# Patient Record
Sex: Male | Born: 1960 | Race: Black or African American | Hispanic: No | Marital: Single
Health system: Southern US, Community
[De-identification: ages and names within clinical notes are randomized; demographics above are authoritative.]

---

## 1998-05-26 ENCOUNTER — Encounter: Payer: Self-pay | Admitting: Emergency Medicine

## 1998-05-26 ENCOUNTER — Emergency Department (HOSPITAL_COMMUNITY): Admission: EM | Admit: 1998-05-26 | Discharge: 1998-05-26 | Payer: Self-pay | Admitting: Emergency Medicine

## 1998-08-17 ENCOUNTER — Emergency Department (HOSPITAL_COMMUNITY): Admission: EM | Admit: 1998-08-17 | Discharge: 1998-08-17 | Payer: Self-pay | Admitting: Emergency Medicine

## 1998-09-26 ENCOUNTER — Emergency Department (HOSPITAL_COMMUNITY): Admission: EM | Admit: 1998-09-26 | Discharge: 1998-09-26 | Payer: Self-pay | Admitting: Emergency Medicine

## 1999-08-02 ENCOUNTER — Inpatient Hospital Stay: Admission: EM | Admit: 1999-08-02 | Discharge: 1999-08-07 | Payer: Self-pay

## 2002-02-13 ENCOUNTER — Inpatient Hospital Stay (HOSPITAL_COMMUNITY): Admission: EM | Admit: 2002-02-13 | Discharge: 2002-02-18 | Payer: Self-pay | Admitting: Emergency Medicine

## 2002-02-13 ENCOUNTER — Encounter: Payer: Self-pay | Admitting: Emergency Medicine

## 2002-02-14 ENCOUNTER — Encounter: Payer: Self-pay | Admitting: Cardiology

## 2002-03-07 ENCOUNTER — Encounter: Payer: Self-pay | Admitting: Emergency Medicine

## 2002-03-08 ENCOUNTER — Encounter: Payer: Self-pay | Admitting: Thoracic Surgery (Cardiothoracic Vascular Surgery)

## 2002-03-08 ENCOUNTER — Inpatient Hospital Stay (HOSPITAL_COMMUNITY): Admission: EM | Admit: 2002-03-08 | Discharge: 2002-03-15 | Payer: Self-pay | Admitting: Emergency Medicine

## 2002-03-09 ENCOUNTER — Encounter: Payer: Self-pay | Admitting: Thoracic Surgery (Cardiothoracic Vascular Surgery)

## 2002-03-10 ENCOUNTER — Encounter: Payer: Self-pay | Admitting: Thoracic Surgery (Cardiothoracic Vascular Surgery)

## 2002-03-11 ENCOUNTER — Encounter: Payer: Self-pay | Admitting: Thoracic Surgery (Cardiothoracic Vascular Surgery)

## 2002-03-12 ENCOUNTER — Encounter: Payer: Self-pay | Admitting: Thoracic Surgery (Cardiothoracic Vascular Surgery)

## 2002-04-21 ENCOUNTER — Encounter
Admission: RE | Admit: 2002-04-21 | Discharge: 2002-04-21 | Payer: Self-pay | Admitting: Thoracic Surgery (Cardiothoracic Vascular Surgery)

## 2002-04-21 ENCOUNTER — Encounter: Payer: Self-pay | Admitting: Thoracic Surgery (Cardiothoracic Vascular Surgery)

## 2005-10-06 ENCOUNTER — Inpatient Hospital Stay (HOSPITAL_COMMUNITY): Admission: EM | Admit: 2005-10-06 | Discharge: 2005-10-08 | Payer: Self-pay | Admitting: Emergency Medicine

## 2005-10-27 ENCOUNTER — Emergency Department (HOSPITAL_COMMUNITY): Admission: EM | Admit: 2005-10-27 | Discharge: 2005-10-27 | Payer: Self-pay | Admitting: Emergency Medicine

## 2006-04-02 ENCOUNTER — Emergency Department (HOSPITAL_COMMUNITY): Admission: EM | Admit: 2006-04-02 | Discharge: 2006-04-02 | Payer: Self-pay | Admitting: Emergency Medicine

## 2006-06-05 ENCOUNTER — Emergency Department (HOSPITAL_COMMUNITY): Admission: EM | Admit: 2006-06-05 | Discharge: 2006-06-05 | Payer: Self-pay | Admitting: Emergency Medicine

## 2008-03-24 IMAGING — CR DG CHEST 2V
2 series · 2 of 2 positions shown · non-contrast
Comparison: none

HISTORY: Hyperglycemia, dizziness, smoker, coronary artery disease status post
CABG

CHEST 2 VIEWS:
Comparison 10/06/2005
Normal heart size status post CABG.
Normal mediastinal contours and pulmonary vascularity.
Tiny scar left lung base.
Lungs otherwise clear.
No pleural effusion or pneumothorax.

[w chest pa *]
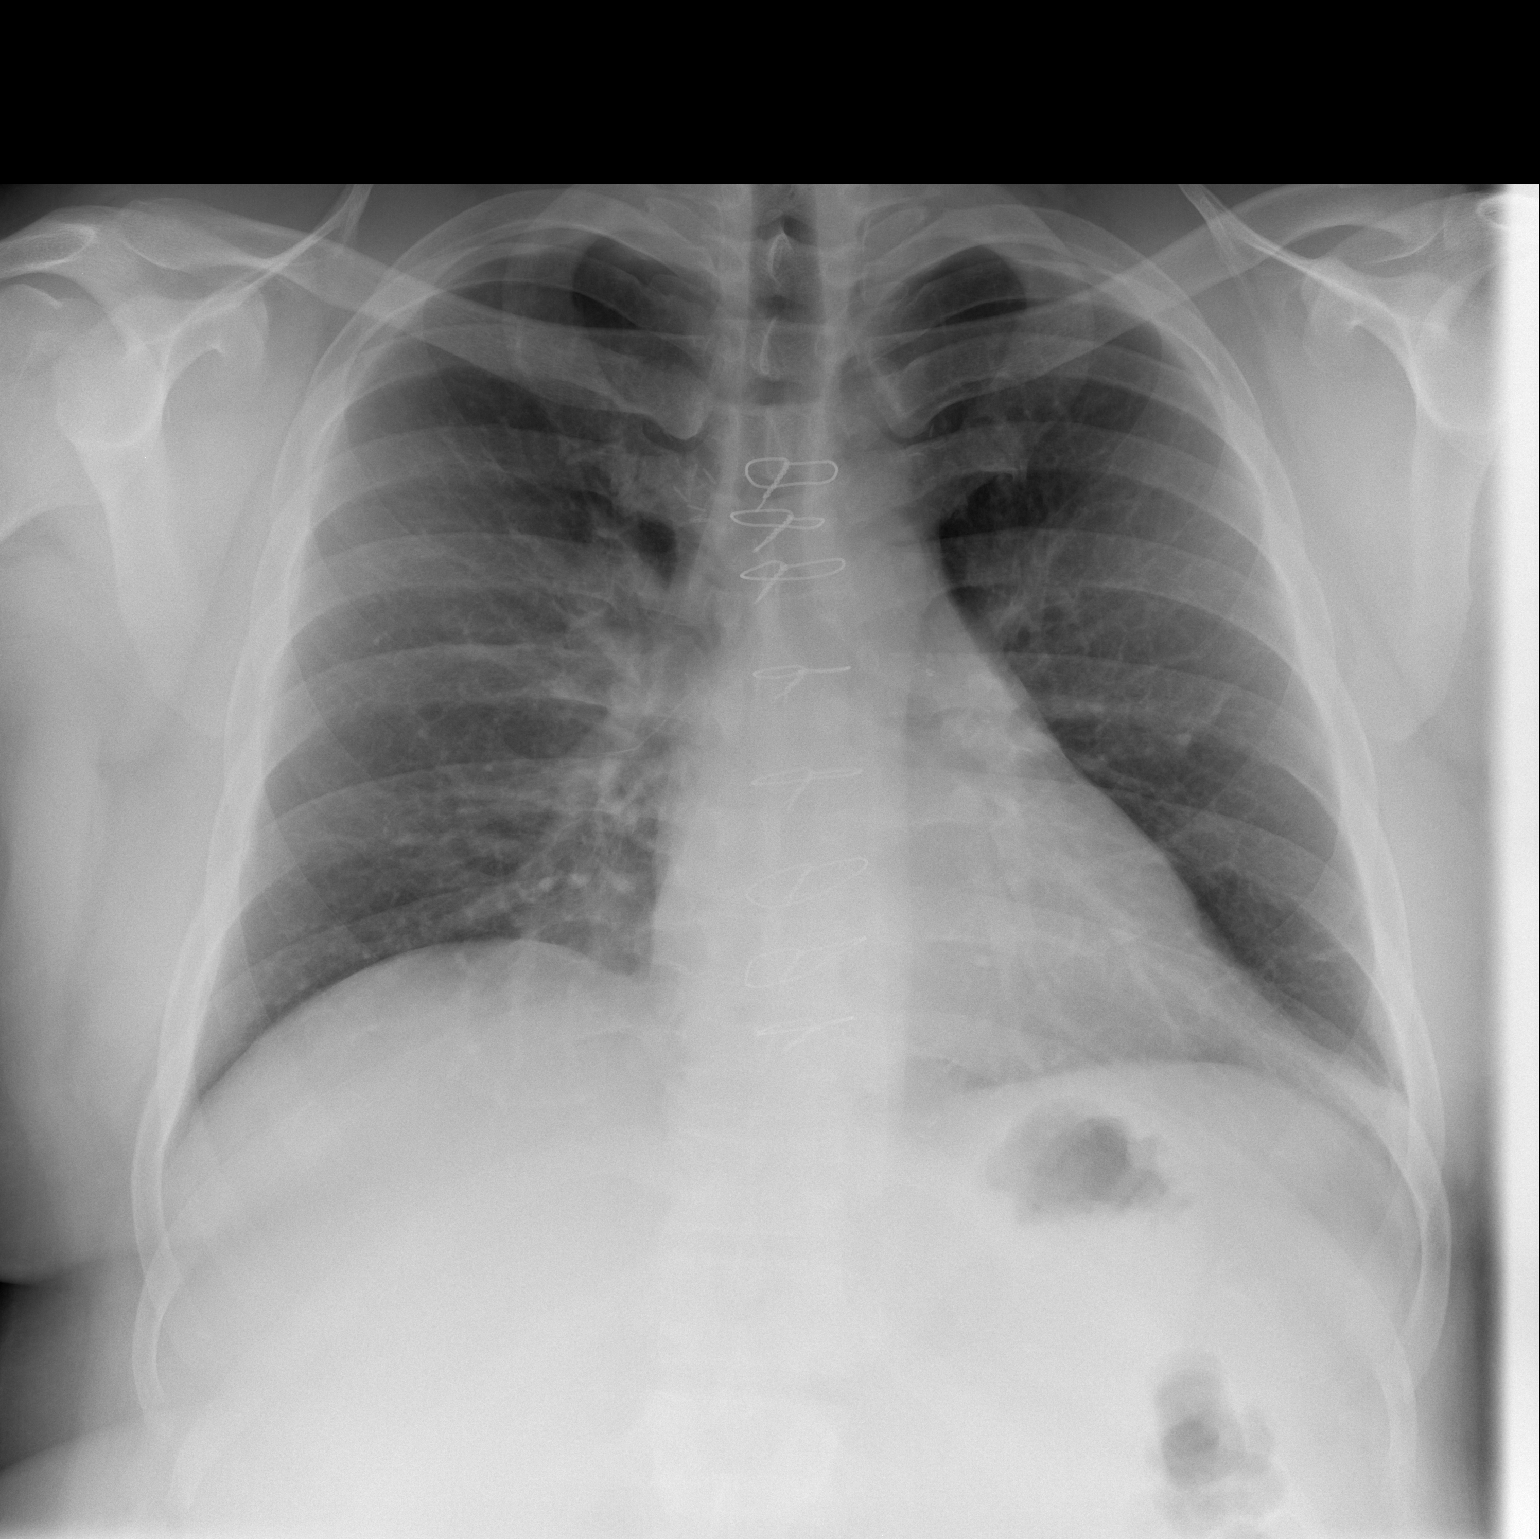

[w chest lat *]
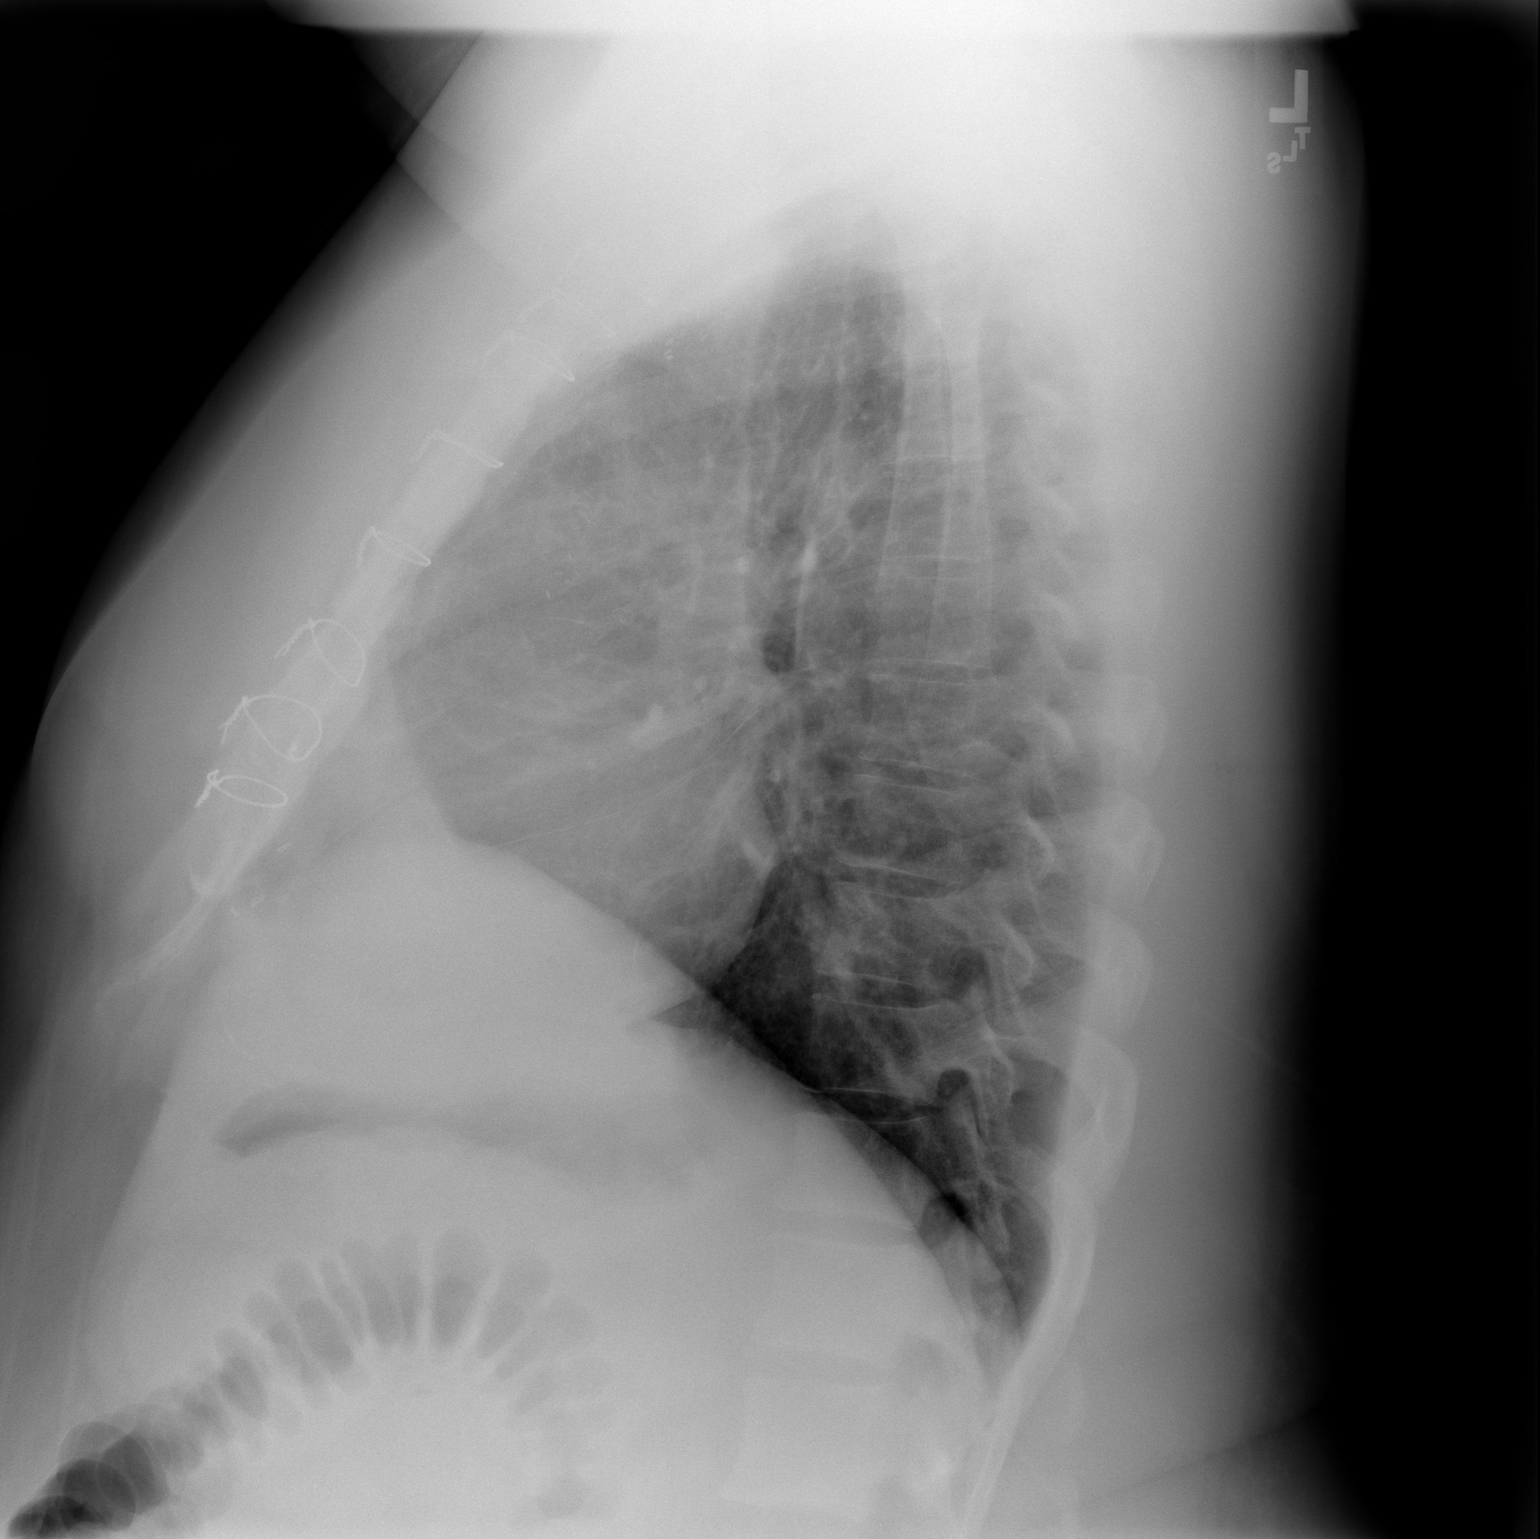

[2 of 2 positions shown; findings below may reference images not displayed]

IMPRESSION: No acute abnormalities.

## 2010-06-03 NOTE — Discharge Summary (Signed)
[facility]  Patient:    Billy [name], Billy [name]                     MRN: [id number] Adm. Date:  [id number] Disc. Date: 08/07/99 Attending:  Dolores Patty                           Discharge Summary  DATE OF BIRTH:  [birth year]  FINAL DIAGNOSES: 1. Ibuprofen overdose. 2. Suicidal attempt. 3. Liver transaminase elevation due to problem #1. 4. Major depression. 5. Cocaine overdose. 6. Gastroesophageal reflux.  DISCHARGE MEDICATIONS:  Protonix 40 mg once a day.  CONSULTATION:  Psychiatry - Dr. Jeanie Sewer, gastroenterology - Dr. Arlyce Dice.  CONDITION ON DISCHARGE:  Stable.  DISPOSITION:  The patient was committed to admission to [facility]. Transfer discussed with Dr. Elby Showers.  HISTORY OF PRESENT ILLNESS:  The patient is a 50 year old male who was admitted to [facility] after suicidal attempt with ibuprofen overdose.  He ingested approximately 60 to 70 over-the-counter ibuprofen Sunday morning at approximately 9 a.m. on July 31, 1999.  He presented to the emergency room on July 16 at 9:51.  Evaluation revealed profound elevation of the hepatic enzymes prompting admission for monitor.  For the details of this admission, please address to Dr. Caryl Never history and physical on August 02, 1999.  HOSPITAL COURSE:  During the course of hospitalization, the patient received IV fluids.  He received Cardizem due to elevation of his blood pressure and Mucomyst.  Hemodynamically, he remained stable.  There were no signs of hepatic encephalopathy.  GI was consulted.  Viral hepatitis A and B were without.  His transaminases have gradually improved.  He was evaluated by psychiatry at ______.  He was seen by Dr. Jeanie Sewer.  No antidepressants were administered during this hospital stay.  On the day of discharge, he has no physical complaints except for occasional indigestion, no chest pain, shortness of breath, nausea or vomiting.  No blood in the  stool.  His temperature is 97.4, heart rate 80, respirations 20, blood pressure 118/70.  His SGOT is 54, SGPT 928, bilirubin 0.7.  INR 1.0, PTT 37. He is in no acute distress.  HEENT moist.  Lungs clear.  Heart regular. Abdomen soft, nontender, no organomegaly, no masses felt.  He is alert, oriented, and cooperative.  LABORATORY DATA:  Drug screen on August 01, 1999, positive for cocaine and cannabis, negative for amphetamines, opiates, barbiturates, tricyclics, benzodiazepine, and phencyclidine.  On August 01, 1999, urinalysis with 40 ketones, 100 protein, 0 to 5 WBCs.  Hepatitis C antibody negative, hepatitis B surface antibody positive, hepatitis B surface antigen negative.  ALT level (SGPT) 6180 on August 02, 4859 on August 02, 2653 on August 04, 1763 on August 04, 1280 on July 21, and 928 on July 22.  AST level (SGOT) 6308 ON July 16, 3445 on the same day in 12 hours, 2013 on July 18, 352 on July 19, 162 on July 20, 85 on July 21, creatinine was normal.  Ammonia level was 49 on July 17.  Sodium and potassium normal.  White count initially 11.8, on July 19, 9.6. Hemoglobin 17.0, on July 19, 14.7.  Glucose was normal.  FOLLOW-UP RECOMMENDATIONS:  Obtain liver function tests and urinalysis in three to four days.   {sec COPIES}Dr. Elby Showers [facility], in [place]{end}DD:  08/07/99 TD:  08/07/99 Job: [id number]

## 2010-06-03 NOTE — H&P (Signed)
[facility]  Patient:    Billy [name], Billy [name]                     MRN: [id number] Adm. Date:  [id number] Attending:  Dolores Patty                         History and Physical  HISTORY OF PRESENT ILLNESS:  Billy [name] is a 50 year old African-American admitted with abdominal pain in the context of ibuprofen overdosage related to suicide ideation.  He ingested approximately 60-70 over-the-counter ibuprofen Sunday morning approximately 9 a.m. on July 31, 1999.  He presented to the emergency room tonight, July 16 at 9:51.  Evaluation revealed profound elevation of hepatic enzymes prompting admission for monitor.  PAST MEDICAL HISTORY:  His past medical history includes a similar attempt on one previous occasion.  He did not seek medical attention at that time but simply "slept for three days."  His only other hospitalization was for hand surgery.  He has recently been completed rehabilitation at [facility] for drug use.  MEDICATIONS:  He is on no prescription medications.  ALLERGIES:  He has no known drug allergies.  SOCIAL HISTORY:  He smokes 10 cigarettes a day.  He does not drink because "my father was an alcoholic."  FAMILY HISTORY:  Positive for myocardial infarction, stroke, hypertension, and diabetes.  There is no cancer in the family.  REVIEW OF SYSTEMS:  Positive for tachycardia which he attributes to drug use, specifically smokes crack cocaine.  He did have nausea and vomiting Sunday morning on three occasions.  He described the vomitus as "black stuff."  He now has steady, diffuse midabdominal pain which he describes as throbbing and 8 on a 10 scale.  He denies any melena or rectal bleeding.  He has had no other bleeding dyscrasias.  He denies chest pain or shortness of breath or GU symptoms.  He admits depression.  A suicide note was xeroxed and on the chart.  He has been evaluated by psychiatry who feels he should be admitted.   The suicide note outlines his desires for disposition of his body after his death. The note consists of apologies to various family members.  PHYSICAL EXAMINATION:  GENERAL:  On examination he appears uncomfortable but is in no acute distress.  VITAL SIGNS:  Blood pressure is 152/100, pulse 85, respiratory rate 18, temperature 97.6.  He is well-nourished.  SKIN:  Warm and dry.  HEENT:  There is a suggestion of arterial narrowing on fundal examination. Otolaryngologic examination is unremarkable.  He has no lymphadenopathy about the head or neck.  Thyroid is normal to palpation.  CHEST:  Totally clear to auscultation.  He has a grade 1 systolic murmur.  ABDOMEN:  Bowel sounds are hyperactive and he is slightly tender in the epigastrium.  Abdomen is soft without organomegaly.  GENITALIA/RECTAL:  Examinations were declined by the patient.  He categorically states "I just had a bowel movement and there is no blood."  EXTREMITIES:  He has full range of motion of his extremities.  All pulses are intact and there is no edema.  NEUROLOGICAL:  There is no localizing neurologic signs.  He is oriented to time, place and person and very open about his drug use and suicide ideation.  LABORATORY STUDIES:  His significant lab studies include a drug screen positive for cocaine and tetrahydrocannabinol.  White count was 11,800, hematocrit was 52.9.  Arterial blood gases on  range of motion revealed a pO2 of 67.1 with a normal pH, and pCO2 of 30.  SGOT is 2131 and SGPT is 2120. Total bilirubin is 1.7.  Amylase and lipase are normal.  Acetaminophen level is less than 10 and silicates are less than 4.  IMPRESSION:  He is admitted with abdominal pain in the setting of ibuprofen overdosage with associated hepatic enzymes.  He also has suicide ideation and will be admitted to [address], psychiatric unit, is hemodynamically stable.  He has hypertension which will be addressed.  PLAN:  Stool cards  will be monitored as well as serial hematocrits.  If he deteriorates he would be transferred to the coronary care unit.  The interval between his ingestion of drugs is over 36 hours and was followed by nausea and vomiting.  For these reasons, GI lavage will not be pursued.  The toxicology overdose was reviewed in the [facility] Manual of Medical Therapeutics and no other interventions were recommended.  Hepatic enzymes will be monitored.  He denies any IV drug abuse and stated he had a hepatitis shot as a teen.  If the hepatic enzyme rise is not short-lived, then additional liver studies will be pursued including imaging and hepatogenic studies. DD:  08/02/99 TD:  08/02/99 Job: [id number]

## 2010-06-03 NOTE — Cardiovascular Report (Signed)
NAMESIMONE, TUCKEY                        ACCOUNT NO.:  [id number]   MEDICAL RECORD NO.:  [id number]                   PATIENT TYPE:  INP   LOCATION:  2112                                 FACILITY:  [facility]   PHYSICIAN:  Darlin Priestly, M.D.             DATE OF BIRTH:  1960-09-03   DATE OF PROCEDURE:  03/08/2002  DATE OF DISCHARGE:                              CARDIAC CATHETERIZATION   INDICATION:  The patient is a 50 year old male, patient of Dr. 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  The  patient had PTCA of the distal LAD at that time.  He was maintained on  IIb/IIIa for approximately 72 hours and brought back to the lab on February 17, 2002, with stenting of the proximal LAD using a 3.0 x 28 Cypher.  The  patient was readmitted on March 07, 2002, by Dr. Daphene Jaeger after thee  patient again used cocaine and developed recurrent chest pain. He did rule  in for MI and continued to have chest pain on the morning of March 08, 2002, and is now brought urgently to the catheterization lab to re-assess  his coronary status.   DESCRIPTION OF PROCEDURE:  After given informed written consent, the patient  was brought to the cardiac catheterization laboratory where his right and  left groin was shaved, prepped and draped in the usual sterile fashion. ECG  monitoring was established.  Using the modified Seldinger technique, a #6  French venous sheath was inserted in the right femoral vein and a #7 [place]  arterial sheath was inserted in the right femoral artery.  Next, under  fluoroscopic guidance, a temporary transvenous pacer wire was then floated  into the RV apex and thresholds were determined.  Next, a #6 [place] JR4  diagnostic catheter was then used to coaxially engage the right coronary  ostium. Selectively angiogram revealed a widely patent RCA which was  dominant and gave rise to a PDA as well as the posterolateral branch.  There  is  no significant disease in the RCA, PDA or posterolateral branch.  This  catheter was then removed and a 7 Japan guiding catheter was then  inserted.  However, this did not appear to fix well into the left main and  this was ultimately exchanged for a 3.5 Voda with side holes.  This revealed  a large LAD which is totally occluded at the proximal portion just prior to  the Cypher stent.  There was visible thrombosis in the proximal portion of  the LAD.  There was a large ramus branch which bifurcated distally with no  significant disease and a large obtuse marginal branch with no significant  disease.   We then attempted to cross the proximal LAD stenotic lesion using a Forte  guide wire.  However, we were unable to pass this beyond the proximal  portion  of the stent.  This was ultimately abandoned and a Graphix PT  exchange length guidewire was then ultimately used to cross into the distal  LAD and placement to the apex.  We then attempted to pass a 3.0 x 20 mm  Maverick into the proximal LAD. However, this continued hanging at the  proximal edge of the stent suggestive of the Graphix wire being beneath the  Cypher strut.  We removed this balloon and attempted multiple times to pass  a second guide wire into the LAD.  It should be noted that we were able to  restore flow into the midportion of the LAD.  We were ultimately successful  in passing a Forte guide wire into the distal LAD.  However, again we were  unable to pass the balloon easily into the mid portion of the vessel.  Again  it was believed that this wire was beneath the strut.  The patient's chest  pain had improved, however, he was continuing to have 1-2/10 chest pain and  shortness of breath.  At this point, Dr. Ashley Mariner was called for a  consultation for possible emergent CABG.  The wires were ultimately removed  and again showed diffuse thrombosis throughout the proximal and midportion  of the LAD with TIMI-1 flow into  the midportion of the LAD.  A pigtail  catheter was then placed in the left ventricle and LV gram revealed 35-40%  EF with anterior hypokinesis and inferoapical and apical akinesis.  This  catheter was then removed and the right arterial sheath was then exchanged  for a 7 French intra-aortic balloon pump.  The balloon pump was then  positioned in the mid descending aorta without complication.  Position was  confirmed and intra-aortic balloon pulsation was begun. Pulse was confirmed  in the patient's left brachial and radial artery.  This was sutured into  place.  The patient was then transferred to the OR.   HEMODYNAMICS:  Systemic arterial pressure 128/75, LV systemic pressure  127/19, LVEDP of 35.   CONCLUSIONS:  1. Unsuccessful percutaneous coronary intervention of a proximal left     anterior descending occlusion.  2. Moderate to severely depressed left ventricular systolic function with     wall motion abnormalities noted above.  3. Adjunct use of Integrilin infusion.  4. Insertion of a temporary transvenous pacer wire.  5. Insertion of a intra-aortic balloon pump to the right femoral artery     without complication.                                                    Darlin Priestly, M.D.    RHM/MEDQ  D:  03/08/2002  T:  03/08/2002  Job:  [id number]

## 2010-06-03 NOTE — Discharge Summary (Signed)
[facility]  Patient:    Billy [name], Billy [name]                     MRN: [id number] Adm. Date:  [id number] Attending:  Dolores Patty                           Discharge Summary  HISTORY OF PRESENT ILLNESS:  Mr. Wesche is a 50 year old black male admitted under unassigned call through the [facility] Emergency Room on August 02, 1999. He was admitted by Dr. Marga Melnick.  He presented with complaints of abdominal pain after he took 60-70 over-the-counter ibuprofen the day prior to admission.  The patient was found to have transaminase levels in the 2000-range.  These prompted medical admission to the medical psychiatric unit.  PAST MEDICAL HISTORY:  Suicide attempt approximately three months before, for which he took Advil and states he slept for three days.  He apparently completed drug rehabilitation at [facility] for drug use approximately two months ago as well.  He apparently went to an outpatient program in [place] after his hospitalization there.  SOCIAL HISTORY:  He lives with his brother.  He has a history of illegal drug use.  He is single.  He has two children, ages 22 and 34.  These children are cared for by his parents.  HOSPITAL COURSE: #1 - OVERDOSE:  The patient left a suicide note; see that in the chart.  He was admitted to the medical psychiatric unit under suicide precautions.  On the day of admission, he clearly stated that he wanted to die.  He stated that he took the overdose of Motrin because he wanted to die.  He was then involuntarily committed and seen by Dr. Jeanie Sewer.  The patients condition has now medically stabilized.  From a psychiatric standpoint, his disposition will be determined by Dr. Jeanie Sewer.  Presumably, he needs inpatient hospitalization due to his suicide attempt.  #2 - ELEVATED LIVER FUNCTION TESTS:  The patients transaminases increased from 2200 on admission to 6600.  His INR rose to a high of 2.9 and is 1.4  on the day of this dictation.  The patient presumably has suffered no long-term side effects due to this overdose of Motrin.  His drug screen on admission was positive for cocaine and marijuana.  It was negative for Tylenol and salicylates.  The patient was placed on Protonix for stomach protection.  He has been oriented throughout hospitalization.  His ammonia level was mildly elevated, but he was never encephalopathic.  I did discuss the possibility of transfer to a transplant center early in the hospitalization.  The patient declined that.  An ethics conference was held, and it was felt that the patient was not able to make informed consent with his recent suicide attempt. This was discussed with his parents.  #3 - RENAL:  The patient suffered no signs of renal compromise after his overdose of Motrin.  His creatinine has resolved to 0.8.  His urine output has been excellent.  FINAL DIAGNOSES: 1. Overdose with suicide attempt. 2. Elevated liver function tests secondary to overdose. 3. Polysubstance abuse.  DISCHARGE MEDICATIONS:  Protonix 40 mg p.o. q.d.  ACTIVITY:  As tolerated.  DIET:  Regular.  DISPOSITION:  Probable transfer to institute for psychiatric care. DD:  08/05/99 TD:  08/05/99 Job: [id number]

## 2010-06-03 NOTE — Consult Note (Signed)
Billy [name], [name]              ACCOUNT NO.:  [id number]   MEDICAL RECORD NO.:  [id number]          PATIENT TYPE:  INP   LOCATION:  2002                         FACILITY:  [facility]   PHYSICIAN:  Excell Seltzer. Annabell Howells, M.D.    DATE OF BIRTH:  May 07, 1960   DATE OF CONSULTATION:  10/06/2005  DATE OF DISCHARGE:  10/08/2005                                   CONSULTATION   Patient of Dr. Daphene Jaeger.   CHIEF COMPLAINT:  Right testicular pain.   HISTORY:  [name] is a 50 year old black male, with a history of  atherosclerotic coronary vascular disease with prior MI and coronary bypass,  who is admitted with recurrent chest pain.  He incidentally reported a  several year history of intermittent right testicular pain with swelling.  He denies voiding complaints.   Past history is pertinent for no drug allergies.   ADMISSION MEDICATIONS:  None.  He had been on medications but was not  compliant.   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  He  had coronary bypass at that time.  He has had reflux, hypertension, tobacco  abuse, obesity as well as hyperlipidemia.   SOCIAL HISTORY:  He is not working.  He denies alcohol but smokes a quarter  pack a day.   REVIEW OF SYSTEMS:  Apart from his chest pain and testicular pain, he is  otherwise without complaints.   On exam, he is well-developed, well-nourished black male in no acute  distress.  Alert and oriented x3.  GU exam reveals an unremarkable phallus  with an adequate meatus.  Scrotum is unremarkable.  Testicles bilaterally  descended, normal in size and consistent without mass or tenderness.  Left  epididymis is unremarkable.  The right epididymis has a slightly tender 1 cm  cystic feeling lesion off the upper pole with some beading of the  epididymis.  It is mildly tender.   IMPRESSION:  Right epididymal lesions.  It is probably a cyst.   RECOMMENDATIONS:  1. I will obtain a scrotal ultrasound to confirm  cystic structure.  2. Obtain a urinalysis.  3. I would use Aleve or Advil for symptomatic relief.      Excell Seltzer. Annabell Howells, M.D.  Electronically Signed     JJW/MEDQ  D:  10/06/2005  T:  10/10/2005  Job:  [id number]   cc:   Nicki Guadalajara, M.D.

## 2010-06-03 NOTE — Op Note (Signed)
NAMESAHAN, PEN                        ACCOUNT NO.:  [id number]   MEDICAL RECORD NO.:  [id number]                   PATIENT TYPE:  INP   LOCATION:  2308                                 FACILITY:  [facility]   PHYSICIAN:  Salvatore Decent. Cornelius Moras, M.D.              DATE OF BIRTH:  1960/04/30   DATE OF PROCEDURE:  DATE OF DISCHARGE:                                 OPERATIVE REPORT   PREOPERATIVE DIAGNOSIS:  Severe 1 vessel coronary artery disease with acute,  evolving anterior myocardial infarction.   POSTOPERATIVE DIAGNOSIS:  Severe 1 vessel coronary artery disease with  acute, evolving anterior myocardial infarction.   PROCEDURE:  Emergency median sternotomy for off-pump coronary artery bypass  grafting x1 (right internal mammary artery to distal left anterior  descending coronary artery).   SURGEON:  Salvatore Decent. Cornelius Moras, M.D.   ASSISTANT:  Coral Ceo, P.A.   ANESTHESIA:  General.   BRIEF CLINICAL NOTE:  The patient is a 50 year old African-American male  with history of cocaine abuse, depression, and previous suicide attempt.  The patient suffered an acute myocardial infarction on 02/13/02.  He was  treated by Dr. Caprice Kluver and underwent percutaneous coronary intervention of  the left anterior descending coronary artery. He was noted to have acute  occlusion of the distal LAD at that time and this was intervened on.  Also,  the patient had dissection of the proximal portion of the left anterior  descending coronary artery and for this, a 3.0 mm Cypher coated stent was  placed in the proximal LAD at that time.  The patient underwent follow up  catheterization later that hospitalization and the proximal LAD appeared  widely patent. There were some irregularities in the distal LAD at that time  although not high grade.  Patient did well medically.   On the evening of 2/20, the patient developed the sudden onset of chest pain  (again) in the setting of smoking cocaine.  He returned  to the emergency  room where he was diagnosed with acute, evolving myocardial infarction.  He  was stabilized medically. Ultimately, on the morning of 2/21, the patient  was taken to the cardiac catheterization lab by Dr. Lenise Herald.  Findings at the time of catheterization were notable for acute occlusion of  the proximal left anterior descending coronary artery at the site of the  previous placement of the Cypher stent. There was some late, sluggish  filling of the mid and distal left anterior descending coronary artery that  could be visualized via collateral circulation. Left ventricular function  was severely reduced with akinesis of the distal anterior wall in the apex.  Left ventricular end-diastolic pressure was elevated. Multiple attempts at  crossing the site of occlusion with a guide wire were attempted, but it was  felt that the guide wire was traversing around the proximal portion of the  stent, and attempt at balloon inflation for percutaneous coronary  intervention was felt unwise. Emergency cardiac surgical consultation was  requested.   After reviewing the patient's history and presentation with cath films from  this hospitalization, as well as those previously; it was felt that emergent  surgical revascularization was probably the best course of therapy; despite  the fact that the patient is more than 12 hours into his acute event.  The  patient has a tremendous amount of left ventricular myocardium at risk and  the potential for salvage of any additional viability in this territory  might provide significant long-term benefit.  The patient was counseled  regarding the indications and potential benefits of surgical  revascularization.  He understands the associated risks, which are elevated  due to his acute presentation and the amount of damage already encountered,  due to the length of time of ischemia involved.  He desired to proceed as  described. An intra-aortic  balloon pump was placed while the patient  remained in the cath lab, by Dr. Lenise Herald.   DESCRIPTION OF PROCEDURE:  The patient was brought directly from the cardiac  cath lab to the operating room on the above-mentioned date and placed in the  supine position on the operating table.  Central monitoring was established  by the anesthesia service under the care and direction of Dr. Bedelia Person.  Surgically, a Swan-Ganz catheter was placed through the right internal  jugular approach. A radial arterial line was placed.  Intravenous  antibiotics are administered.  The patient's pulmonary artery pressures are  moderately elevated. Cardiac output is preserved. Following induction of  general endotracheal anesthesia, a Foley catheter was placed.  The patient's  chest, abdomen, both groins and both lower extremities are prepared and  draped in a sterile manner.   A median sternotomy incision was performed in the left internal mammary  artery with dissection from the chest wall, and prepared for bypass  grafting.  However, left internal mammary artery is very small and  diminutive and after harvesting, it was felt clearly not suitable to  utilized as conduit.  It measures barely 1 mm in diameter and has very  little flow.  Subsequently, the right internal mammary artery is harvested  from the chest wall and prepared for bypass grafting. This vessel is much  larger and much better quality, and felt to be suitable to be utilized as a  conduit for bypass grafting.  The patient was heparinized systemically.   The pericardium was opened. The ascending aorta is normal in appearance.  The anterior surface and the apex of the left ventricle is inspected.  It is  modestly dusky, consistent with acute evolving myocardial infarction.  No  other abnormalities are identified.  The Guidant stabilization system is  utilized to facilitate off-pump coronary artery bypass grafting. A combination of the vacuum  suction cup for the apex of the heart to  facilitate retraction of the heart with destabilization bar are utilized.  The patient is placed in gentle Trendelenburg position and the table rotated  towards the surgeon's side.  Exposure was felt to be excellent.  A single  proximal black tape is placed as a loop around the proximal left anterior  descending coronary artery to facilitate hemostasis.   The following distal coronary anastomosis is performed:  The distal left  anterior descending coronary artery is grafted with a right internal mammary  artery as an in situ graft in an end-to-side fashion.  The left anterior  descending coronary artery measures 2.0 mm in  diameter and is of good  quality at the site of the distal bypass. The right internal mammary artery  is felt to be good quality conduit. The anastomosis is constructed  uneventfully and is technically straightforward.  The right internal mammary  artery pedicle traverses across the anterior mediastinum, anterior to the  ascending aorta.  Protamine is administered to reverse the anticoagulation.   The mediastinum and both left and right pleural spaces are irrigated with  saline solution containing vancomycin. Meticulous surgical hemostasis is  ascertained. The mediastinum and left and right pleural spaces are drained  with 4 chest tubes placed through separate stab incisions inferiorly. The  median sternotomy is closed in routine fashion.  The sternal incision was  closed with subcuticular skin closure.   The patient tolerated the procedure well and was transported to the  surgical intensive care unit in stable condition. There were no  intraoperative complications. All sponge, instrument and needle counts were  verified correct at completion of the operation.  No blood products were  administered.                                                   Salvatore Decent. Cornelius Moras, M.D.    CHO/MEDQ  D:  03/08/2002  T:  03/08/2002  Job:   [id number]   cc:   Thereasa Solo. Little, M.D.  [address]  [place] [address]  Fax: [phone]   Darlin Priestly, M.D.  [phone] [address], [address]  [place]  [place] [address]  Fax: [phone]   Titus Dubin. Alwyn Ren, M.D. [facility]

## 2010-06-03 NOTE — Cardiovascular Report (Signed)
NAME:  Billy [name], Billy [name]                        ACCOUNT NO.:  [id number]   MEDICAL RECORD NO.:  [id number]                   PATIENT TYPE:  INP   LOCATION:  1825                                 FACILITY:  [facility]   PHYSICIAN:  Thereasa Solo. Little, M.D.              DATE OF BIRTH:  01/18/1960   DATE OF PROCEDURE:  02/13/2002  DATE OF DISCHARGE:                              CARDIAC CATHETERIZATION   PROCEDURE:  Cardiac catheterization.   INDICATION:  The patient is a 50 year old male who presented to the  emergency room after using cocaine for 12 hours.  He had developed chest  pain around 6 o'clock in the morning and presented at 5 o'clock in the  afternoon with recurrent chest pain.  He had had multiple episodes at home  of pounding fluttering in his chest to the point he had hit his chest  several times thinking his heart was about to stop.   His EKG shows changes of an acute anterior myocardial infarction, suspected  cocaine induced.   DESCRIPTION OF PROCEDURE:  The patient was prepped and draped in the usual  sterile fashion, exposing the right groin.  After applying local anesthetic  with 1% Xylocaine, the Seldinger technique was employed and a 7-French  introducer sheath was placed into the right femoral artery.  Left and right  coronary arteriography, emergency intervention and ventriculography in the  RAO projection were performed.   While the patient was being prepped, even before the catheterization was  started, the patient developed an episode of ventricular tachycardia of  about 40 seconds in duration.  It resolved with a precordial thump.  He had  occasional PVCs following this during the cardiac catheterization and  suspect this was actually a reperfusion arrhythmia.   RESULTS:  1. Hemodynamic monitoring:  Central aortic pressure was 115/73.  Left     ventricular pressure 118/10.  There was no aortic valve gradient noted at     the time of pullback.  2.  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  3. Coronary arteriography:     a. Left main normal.     b. LAD:  The LAD was a large-diameter vessel about 4 to 4.5 mm.  It        coursed down towards the apex of the heart and abruptly stopped just        before the apex.  In the proximal portion of this vessel was an area        of clot.  There was brisk TIMI-3 flow past this area of clot.  There        appeared to be at least 2.5 mm of the vessel not involved with the  thrombus.  Diagonal free of disease.     c. Optional diagonal free of disease.     d. Circumflex normal.     e. Right coronary artery normal.   Intracoronary IV nitroglycerin was given with no change in distal flow.  The  patient had already been given a bolus of heparin and was on Integrilin  drip.  A JL4 7-French guide catheter, a short luge wire and a 2.5 x 20.0-mm  long CrossSail balloon were used.  The wire was placed through the area of  total occlusion, well around the apex of the heart.  The balloon was  positioned in such a manner that starting where the initial termination of  the native blood vessel appeared and gradually advanced all the way around  to the apex of the heart.  A series of 6 inflations ranging from 10 to 13  atmospheres for 55 to 64 seconds were performed; in addition to this,  intracoronary nitroglycerin was given a second time.  There was no evidence  of any distal thrombus formation, but there was no obvious runoff of this  distal bed.  TIMI-1 flow was noted following the intervention.   The patient's ACT at the termination of the procedure was 368.   PLAN:  The plan is to keep the patient on IV Integrilin until Monday;  hopefully, the proximal clot will resolve with this; this should also help  any distal thrombus, however, the apex of the  heart clearly appears to be  irreversibly injured, with akinesis consistent with the distribution of the  occluded distal LAD.   Long-term outlook if the patient continues to use cocaine is extremely  bleak.                                               Thereasa Solo. Little, M.D.    ABL/MEDQ  D:  02/13/2002  T:  02/14/2002  Job:  [id number]   cc:   Titus Dubin. Alwyn Ren, M.D. [facility] Cardiac Catheter[facility]

## 2010-06-03 NOTE — Discharge Summary (Signed)
Billy [name], Billy [name]                        ACCOUNT NO.:  [id number]   MEDICAL RECORD NO.:  [id number]                   PATIENT TYPE:  INP   LOCATION:  2029                                 FACILITY:  [facility]   PHYSICIAN:  Salvatore Decent. Cornelius Moras, M.D.              DATE OF BIRTH:  15-Aug-1960   DATE OF ADMISSION:  03/07/2002  DATE OF DISCHARGE:  03/15/2002                                 DISCHARGE SUMMARY   PRIMARY CARDIOLOGIST:  Gaspar Garbe B. Little, M.D.   FINAL DIAGNOSES:  1. Severe one vessel coronary artery disease.  2. Acute anterior myocardial infarction.  3. History of substance abuse.  4. Gastroesophageal reflux disease.  5. Postoperative volume excess.  6. Hypertension.   PROCEDURES:  1. Emergent cardiac catheterization with intra-aortic balloon pump insertion     on 03/08/2002.  2. Off-pump CABG x1 on 03/08/2002 with right internal mammary artery to LAD.   BRIEF HISTORY:  The patient was a 50 year old black male with a history of  cocaine abuse, depression, and previous suicide attempt.  He had a previous  acute MI 02/13/2002.  He was evaluated by Al Little and underwent  percutaneous coronary intervention of the LAD at that time.  He was noted to  have an acute occlusion of the distal LAD at that time and this was  intervened on.  He also had a dissection of the proximal portion of the LAD  and a 3 mm CYPHER coated stent was placed in the proximal LAD at that time.  He did well medically after that however, the evening of 03/07/2002 he  developed sudden chest pain in the setting of smoking cocaine.  He went to  the ER where he was diagnosed with acute evolving MI.  He was stabilized  medically.  The morning of 03/08/2002 he was taken to the cardiac cath lab  by Dr. Jenne Campus.  He was noted to have an acute occlusion of the proximal LAD  at the site of the previous CYPHER stent.  Dr. Cornelius Moras was consulted.  Dr.  Jenne Campus inserted an intra-aortic balloon pump.  Later, the patient  was taken  for emergent CABG x1.  He tolerated the procedure well and was taken to SICU  in stable condition.  Intra-aortic balloon pump was discontinued the  following day.  He was weaned and extubated.  He was transferred to Unit  2000 on 03/11/2002.  There were no major postop complications.  He was  counseled on smoking cessation and substance abuse.  He had a low-grade  temperature which was secondary to atelectasis.  Beta blocker was increased  for tachycardia.  He otherwise remained in sinus rhythm.  He began working  with cardiac rehab doing fairly well.  By 03/15/2002, postop day #7, he was  doing well; he was in normal sinus rhythm 83 beats per minute; BP 110/50; he  was afebrile; 95% on room air; physical exam was  satisfactory; his wounds  were healing well; he was neurologically intact, alert and oriented; he was  discharged home in stable condition.   MEDICATIONS AT TIME OF DISCHARGE:  1. Lasix 40 mg daily.  2. KCl 20 mEq daily.  3. Lopressor 50 mg p.o. b.i.d.  4. Enteric-coated aspirin 325 mg daily.  5. Mavik 1 mg one p.o. once daily.  6. Paxil 20 mg daily.  7. Percocet one to two every 4-6 hours p.r.n. for pain.   SPECIAL INSTRUCTIONS:  He was told to avoid driving, working, heavy lifting,  strenuous activity.  He was told to walk daily and use his incentive  spirometer daily.  He was told to get a chest x-ray at [facility] 1 hour before seeing Dr. Cornelius Moras.  He was again counseled on smoking  cessation and substance abuse.   CONDITION:  Stable.   DISPOSITION:  Home.   FOLLOW UP:  1. Dr. Clarene Duke 2 weeks after discharge.  2. Dr. Cornelius Moras Monday, 04/21/2002 at 12:15 p.m.     Lissa Merlin, P.A.                          Salvatore Decent. Cornelius Moras, M.D.    Alwyn Ren  D:  04/16/2002  T:  04/17/2002  Job:  [id number]   cc:   Thereasa Solo. Little, M.D.  [address]  [place] [address]  Fax: [phone]

## 2010-06-03 NOTE — Discharge Summary (Signed)
NAMEDAISEAN, BRODHEAD              ACCOUNT NO.:  [id number]   MEDICAL RECORD NO.:  [id number]          PATIENT TYPE:  INP   LOCATION:  2002                         FACILITY:  [facility]   PHYSICIAN:  Nicki Guadalajara, M.D.     DATE OF BIRTH:  August 18, 1960   DATE OF ADMISSION:  10/06/2005  DATE OF DISCHARGE:  10/08/2005                                 DISCHARGE SUMMARY   Mr. Woodfield is a 50 year old with history of ASCVD, CABG, and MI who comes to  the emergency room complaining of chest pain on the left side, up the left  neck, shooting pain occurring x1 week.  States that it was different from  his MI pain.  He was seen by Dr. Tresa Endo, admitted, and put on IV heparin and  will be ruled out for an MI.  He was also complaining about testicular pain,  uncertain etiology,  GU consult was called.  His CKs were elevated; his MBs  were not, and his troponins were not elevated.  He continued to have chest  pain that woke him about 4 a.m. that lasted for about 30 minutes but he did  not call a nurse.  His EKG showed old anterior MI and thought to be no other  significant change.  It was decided to keep him another day and that he  could undergo outpatient stress test.  The following day, he was doing well.  He had no further chest pain.  He continued with some testicular pain.  A  scrotal ultrasound was done and it showed a small epididymal cyst as  expected.  Recommendations were to follow up with Dr. Annabell Howells on a p.r.n.  basis.   LABORATORIES:  Hemoglobin was 14.7, hematocrit 43.2, WBCs 10, and platelets  225.  Sodium was 139, potassium 4, BUN 12, creatinine 1.1.  Total  cholesterol was 201, triglycerides 230.  LDL was 115, HDL was 40.  CK-MB is  (1) 297/3.3, troponin of 0.04; (2) 274/3.2, troponin of 0.04; (3) 248/2.9,  troponin of 0.05.  Urinary drug screen was done and no drugs were detected.   DISCHARGE MEDICATIONS:  1. Aspirin 325 mg daily.  2. Toprol XL 50 mg every day.  3. Lipitor 40 mg every  day.  4. Lisinopril 10 mg every day.  5. Isosorbide mononitrate 30 mg every day.  6. Nitroglycerin 1/150 grain,  one under his tongue as needed for chest      pain.   FOLLOWUP:  He will follow up with stress test.  Our office will call with  appointments on Monday.   DISCHARGE DIAGNOSES:  1. Atypical chest pain.  2. Non coronary artery disease with history of anterior myocardial      infarction with prior history of cocaine abuse and undergoing coronary      artery bypass grafting.  3. Hypertension.  Patient came in not on any medications.  He has been off      his medications for several months.  4. Noncompliance with medications.  5. Hyperlipidemia.  6. Obesity.  7. Tobacco use.  8. Testicular pain with scrotal ultrasound showed  epididymal cyst.      Lezlie Octave, N.P.    ______________________________  Nicki Guadalajara, M.D.    BB/MEDQ  D:  10/24/2005  T:  10/25/2005  Job:  [id number]   cc:   Dr. Annabell Howells

## 2010-06-03 NOTE — Discharge Summary (Signed)
Billy [name], Billy [name]                        ACCOUNT NO.:  [id number]   MEDICAL RECORD NO.:  [id number]                   PATIENT TYPE:  INP   LOCATION:  2928                                 FACILITY:  [facility]   PHYSICIAN:  Thereasa Solo. Little, M.D.              DATE OF BIRTH:  [birth year]   DATE OF ADMISSION:  02/13/2002  DATE OF DISCHARGE:  02/18/2002                                 DISCHARGE SUMMARY   ADMISSION DIAGNOSES:  1. Acute anterior myocardial infarction secondary to cocaine abuse.  2. Cocaine abuse.   DISCHARGE DIAGNOSIS:  Acute anterior myocardial infarction secondary to drug  abuse.   PROCEDURE:  Cardiac catheterization February 13, 2002 and February 17, 2002.   COMPLICATIONS:  None.   DISCHARGE STATUS:  Stable.   ADMISSION HISTORY:  This is a 50 year old African American male with no  prior cardiac history.  Apparently has had longstanding psychiatric history  with some suicide attempts, depression, as well as cocaine abuse.  Apparently had smoked cocaine the day of admission from 6 p.m. until 5 a.m.  on the morning of this admission.  He developed anterior chest discomfort  around 6 a.m.  He had some complaint of palpitations.  Apparently no other  symptoms.  Presented to the ER by EMS.   PHYSICAL EXAMINATION ON ADMISSION:  VITAL SIGNS:  Blood pressure 154/63,  heart rate 69.  He was afebrile and O2 saturations were good.   LABORATORY DATA:  EKG showed sinus rhythm with Q waves in the inferior  leads; i.e. II, II, and aVF.  Chest x-ray showed no active disease.   Admission labs showed a normal CBC and CMP.  LFT's were normal.  Initial  CK's were 1462 with 171 MB and a troponin of 6.80.   HOSPITAL COURSE:  The patient was placed on usual IV therapies; i.e.  nitroglycerin, etc; however, did not respond to standard therapy.  He was  taken emergently to the catheterization lab with suspected coronary spasm  and possible thrombus.   Emergency left heart  catheterization was performed February 13, 2002 in the  setting of an acute anterior MI secondary to cocaine abuse.  His chest pain  had been ongoing for 11 hours at this point.  Just before catheterization  was begun, the patient developed ventricular tachycardia and it lasted  approximately 40 seconds.  It was resolved with precordial thump.  Results  of cardiac catheterization show normal left main, normal circumflex and  right coronary artery.  The LAD showed a clot in the proximal region, as  well as an area of 100% of the very distal LAD just before the apex.  The  diagonal was free of disease.  There was also noted akinesis of the apex.  An emergency PTCA was performed to the distal LAD.  Intracoronary  nitroglycerin was given; however, there was no significant change in the  flow or diameter of the  vessel.   The patient was placed on Integrilin.  Aspirin and Plavix were continued.  He will be brought back to the catheterization lab after IV Integrilin for  relook at the LAD.   For the rest of the admission, the patient remained stable.  He had no  further chest pain.  No further ventricular tachycardia.  He was continued  on IV Integrilin and nitroglycerin.  Repeat lab work was stable.  Repeat  cardiac enzymes showed a total CK of 1663 with 174 MB and a troponin of  21.88 decreasing to a CK of 324 with 13 MB and a troponin of 7.88.  EKG  continued to show evidence of inferior MI with now anterior ST and T-wave  changes in V3-V6.   On February 17, 2002, the patient was taken back to the catheterization lab.  Left main again was normal.  The LAD showed an eccentric area proximally of  approximately 50%-60% with a questionable thrombus.  Distal LAD was patent  where the PTCA had been performed two days previously.  The rest of the  coronaries were very diseased.  EF was estimated at 45%.  AngioJet was  undertaken x2 to the LAD.  There was questionable dissection.  Primary  stenting  to the proximal LAD was performed with a Cypher stent.  He remained  on Integrilin for an additional 12 hours.   On February 18, 2002, the patient was discharged home in stable condition.  He had no recurrent chest pain.  A long discussion with the patient, as well  as his mother, as to the absolute importance of abstinence from drug use.   DISCHARGE MEDICATIONS:  1. Aspirin 325 mg daily.  2. Plavix 75 mg daily.  3. Mavik 1 mg daily.  4. Toprol XL 50 mg 1/2 tablet b.i.d.   ACTIVITY:  The patient is not to work for 6-8 weeks.  He is not to undertake  any strenuous physical activity for the next 6-8 weeks.   DIET:  He is to maintain a low-salt/low-fat/low-cholesterol diet.   DISCHARGE INSTRUCTIONS:  He may shower once home; however, have asked for  him to refrain from soaking in a bathtub or hot tub for the next two days.  If he has any increased pain, bruising, or swelling to his right groin  catheterization site he is to contact Dr. Clarene Duke.  Otherwise, he will need  to follow up with Dr. Clarene Duke in 2-3 weeks post discharge.  He is to call and  set up an appointment.     Adrian Saran, N.P.                        Thereasa Solo. Little, M.D.    HB/MEDQ  D:  04/16/2002  T:  04/17/2002  Job:  [id number]

## 2010-06-03 NOTE — Cardiovascular Report (Signed)
Billy [name], Billy [name]                        ACCOUNT NO.:  [id number]   MEDICAL RECORD NO.:  [id number]                   PATIENT TYPE:  INP   LOCATION:  2928                                 FACILITY:  [facility]   PHYSICIAN:  Thereasa Solo. Little, M.D.              DATE OF BIRTH:  06-Nov-1960   DATE OF PROCEDURE:  02/17/2002  DATE OF DISCHARGE:                              CARDIAC CATHETERIZATION   PROCEDURE:  Cardiac catheterization.   INDICATION:  The patient is a 50 year old male who was admitted on February 13, 2002 with an anterior myocardial infarction secondary to cocaine use.  The distal portion of the LAD was totally occluded and was treated with  angioplasty with marginal result.  There was a nonocclusive thrombus in the  proximal portion of the LAD and the patient has been maintained on IV  Integrilin for 72 hours plus.  He is brought back in for a relook, making  sure that the thrombus has resolved.   DESCRIPTION OF PROCEDURE:  The patient was prepped and draped in the usual  sterile fashion, exposing the right groin.  After applying local anesthetic  with 1% Xylocaine, the Seldinger technique was employed and 5-French  introducer sheath was placed into the right femoral artery.  Left and right  coronary arteriography and ventriculography in the RAO projection were  performed.   RESULTS:  1. Central aortic pressure was 103/67.  Left ventricular pressure was     109/13.  The left ventricular end-diastolic pressure was 22 and there was     no significant gradient noted at the time of pullback.  2. Ventriculography:  Ventriculography in the RAO projection revealed the     apex to be akinetic.  The ejection fraction was around 45%.  The end-     diastolic pressure was 22.  3. Coronary arteriography:  No calcification was seen on fluoroscopy.     a. Left main:  Normal.     b. LAD:  The proximal portion of the LAD was a 50-60% area of eccentric        narrowing that was thought  to be thrombus.  It had not substantially        changed since February 13, 2002.  The distal portion of the LAD was now        patent.  It had been totally occluded on February 13, 2002 and was        treated with less than adequate PTCA.  There was evidence of        dissection at the apex of the heart, but there was TIMI-3 flow.  The        diagonal branch was free of disease.     c. Optional diagonal.  Normal.     d. Circumflex:  Normal.     e. Right coronary artery:  Normal.   At this point, the concern was the  proximal portion of the LAD.  After  obtaining two opinions from other cardiologists, the decision was made to go  in and try to treat the proximal thrombus with AngioJet.   The 7-French sheath was exchanged out for the 5-French sheath and a 6-French  introducer sheath was placed into the right femoral vein.  A 5-French  temporary pacemaker was placed prophylactically into the apex of the heart.   The AngioJet was placed in the LAD and two runs with the AngioJet were  undertaken.  The patient had diffuse coronary spasm following this and  intracoronary nitroglycerin resolved this.  Despite the AngioJet, there  still appeared to be an area of at least 40% narrowing that actually looked  now more like a dissection and I suspect there was a clot that had formed on  a spontaneous dissection that occurred as the result of cocaine.   Because of what appeared to be dissection in the proximal portion of the  LAD, stenting with a 3.0 x 20.0-mm CYPHER coated stent was done.  The stent  was placed so that both the proximal and distal portions were well-covered.  The stent was deployed at 17 atmospheres for 60 seconds, with the second  inflation being 17 atmospheres for 62 seconds.  Following stenting, the  vessel appeared to be normal in the proximal segment.  There was brisk TIMI-  3 flow.   The ACT at the termination of the procedure was 252.  The patient will be  maintained on  Integrilin for 12 additional hours and should be ready for  discharge tomorrow.   In view of the myocardial infarction that he sustained on February 13, 2002  as a result of cocaine use, he will need to be out of work with no strenuous  activity for four to six weeks.  In addition to this, he will need to be on  Plavix for a minimum of three months.  He is already on beta blockers and  ACE inhibitors.  I have asked social services to see the patient regarding  help with his medications.   If the patient returns to using illicit drugs, I suspect he will have a  fatal event, despite aggressive medical therapy.                                               Thereasa Solo. Little, M.D.    ABL/MEDQ  D:  02/17/2002  T:  02/17/2002  Job:  [id number]   cc:   Titus Dubin. Alwyn Ren, M.D. [facility]

## 2013-06-23 ENCOUNTER — Other Ambulatory Visit: Payer: Self-pay | Admitting: *Deleted

## 2013-06-24 NOTE — Telephone Encounter (Signed)
Open error
